# Patient Record
Sex: Male | Born: 1975 | Race: Black or African American | Hispanic: No | Marital: Single | Smoking: Current every day smoker
Health system: Southern US, Community
[De-identification: ages and names within clinical notes are randomized; demographics above are authoritative.]

---

## 2000-07-28 ENCOUNTER — Emergency Department (HOSPITAL_COMMUNITY): Admission: EM | Admit: 2000-07-28 | Discharge: 2000-07-28 | Payer: Self-pay | Admitting: Emergency Medicine

## 2010-01-19 ENCOUNTER — Emergency Department (HOSPITAL_COMMUNITY): Admission: EM | Admit: 2010-01-19 | Discharge: 2010-01-19 | Payer: Self-pay | Admitting: Emergency Medicine

## 2010-02-13 ENCOUNTER — Emergency Department (HOSPITAL_COMMUNITY): Admission: EM | Admit: 2010-02-13 | Discharge: 2010-02-13 | Payer: Self-pay | Admitting: Emergency Medicine

## 2010-02-21 ENCOUNTER — Emergency Department (HOSPITAL_COMMUNITY): Admission: EM | Admit: 2010-02-21 | Discharge: 2010-02-21 | Payer: Self-pay | Admitting: Orthopaedic Surgery

## 2010-03-07 ENCOUNTER — Emergency Department (HOSPITAL_COMMUNITY): Admission: EM | Admit: 2010-03-07 | Discharge: 2010-03-07 | Payer: Self-pay | Admitting: Emergency Medicine

## 2010-03-16 ENCOUNTER — Emergency Department (HOSPITAL_COMMUNITY): Admission: EM | Admit: 2010-03-16 | Discharge: 2010-03-16 | Payer: Self-pay | Admitting: Emergency Medicine
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
## 2011-01-16 LAB — URINALYSIS, ROUTINE W REFLEX MICROSCOPIC
Bilirubin Urine: NEGATIVE
Glucose, UA: NEGATIVE mg/dL
Ketones, ur: 15 mg/dL — AB
Nitrite: NEGATIVE

## 2011-01-16 LAB — URINE MICROSCOPIC-ADD ON

## 2011-01-22 LAB — GC/CHLAMYDIA PROBE AMP, GENITAL: Chlamydia, DNA Probe: NEGATIVE

## 2013-05-06 ENCOUNTER — Emergency Department (HOSPITAL_COMMUNITY)
Admission: EM | Admit: 2013-05-06 | Discharge: 2013-05-06 | Disposition: A | Discharge status: Home / Self Care | Payer: Self-pay | Attending: Emergency Medicine | Admitting: Emergency Medicine

## 2013-05-06 ENCOUNTER — Encounter (HOSPITAL_COMMUNITY): Payer: Self-pay

## 2013-05-06 DIAGNOSIS — R300 Dysuria: Secondary | ICD-10-CM | POA: Insufficient documentation

## 2013-05-06 DIAGNOSIS — N342 Other urethritis: Secondary | ICD-10-CM | POA: Insufficient documentation

## 2013-05-06 MED ORDER — AZITHROMYCIN 250 MG PO TABS
1000.0000 mg | ORAL_TABLET | Freq: Once | ORAL | Status: AC
Start: 2013-05-06 — End: 2013-05-06
  Administered 2013-05-06: 1000 mg via ORAL
  Filled 2013-05-06: qty 4

## 2013-05-06 MED ORDER — CEFTRIAXONE SODIUM 250 MG IJ SOLR
250.0000 mg | Freq: Once | INTRAMUSCULAR | Status: AC
  Administered 2013-05-06: 250 mg via INTRAMUSCULAR
  Filled 2013-05-06: qty 250

## 2013-05-06 MED ORDER — LIDOCAINE HCL (PF) 1 % IJ SOLN
INTRAMUSCULAR | Status: AC
  Administered 2013-05-06: 1 mL
  Filled 2013-05-06: qty 5

## 2013-05-06 NOTE — ED Provider Notes (Signed)
   History    CSN: [id number] Arrival date & time 05/06/13  1113  First MD Initiated Contact with Patient 05/06/13 1211     Chief Complaint  Patient presents with  . 74.5    (Consider location/radiation/quality/duration/timing/severity/associated sxs/prior Treatment) Patient is a 37 y.o. male presenting with penile discharge. The history is provided by the patient. No language interpreter was used.  Penile Discharge This is a new problem. The current episode started yesterday. Pertinent negatives include no abdominal pain, fever or nausea. Associated symptoms comments: "I think I got something during sex." He reports penile discharge and burning with urination for the past 2 days. No fever, abdominal pain, testicular pain or scrotal swelling. Marland Kitchen   History reviewed. No pertinent past medical history. History reviewed. No pertinent past surgical history. No family history on file. History  Substance Use Topics  . Smoking status: Never Smoker   . Smokeless tobacco: Not on file  . Alcohol Use: No    Review of Systems  Constitutional: Negative for fever.  Gastrointestinal: Negative for nausea and abdominal pain.  Genitourinary: Positive for dysuria and discharge. Negative for hematuria, flank pain, scrotal swelling and testicular pain.    Allergies  Review of patient's allergies indicates no known allergies.  Home Medications  No current outpatient prescriptions on file. BP 131/70  Pulse 70  Temp(Src) 98.5 F (36.9 C) (Oral)  Resp 20  Ht 5\' 10"  (1.778 m)  Wt 240 lb (108.863 kg)  BMI 34.44 kg/m2  SpO2 97% Physical Exam  Constitutional: He is oriented to person, place, and time. He appears well-developed and well-nourished. No distress.  Pulmonary/Chest: Effort normal.  Genitourinary: Penis normal.  No scrotal swelling or mass. No testicular tenderness.   Lymphadenopathy:       Left: Inguinal adenopathy present.  Neurological: He is alert and oriented to person, place, and  time.    ED Course  Procedures (including critical care time) Labs Reviewed  GC/CHLAMYDIA PROBE AMP   No results found. No diagnosis found. 1. Urethritis  MDM  GC/Chlamydia culture pending. Patient treated with Rocephin and azithromycin.   Arnoldo Hooker, PA-C 05/06/13 1342

## 2013-05-06 NOTE — ED Notes (Signed)
Pt c/o penile discharge and burning with urination x 2 days.

## 2013-05-07 LAB — GC/CHLAMYDIA PROBE AMP
CT Probe RNA: NEGATIVE
GC Probe RNA: POSITIVE — AB

## 2013-05-07 NOTE — ED Provider Notes (Signed)
Medical screening examination/treatment/procedure(s) were performed by non-physician practitioner and as supervising physician I was immediately available for consultation/collaboration.    Shereece Wellborn J. Jyaire Koudelka, MD 05/07/13 0815 

## 2013-05-08 NOTE — ED Notes (Signed)
+   Gonorrhea Patient treated with Rocephin and Zithromax [employer] letter faxed

## 2013-05-10 ENCOUNTER — Telehealth (HOSPITAL_COMMUNITY): Payer: Self-pay | Admitting: Emergency Medicine

## 2013-05-17 ENCOUNTER — Telehealth (HOSPITAL_COMMUNITY): Payer: Self-pay | Admitting: Emergency Medicine

## 2013-05-17 NOTE — ED Notes (Signed)
Unable to contact patient via phone. Sent letter. °

## 2013-06-01 NOTE — ED Notes (Signed)
No response from letter after 30 days.Chart closed out and sent to medical records 

## 2013-10-01 ENCOUNTER — Encounter (HOSPITAL_COMMUNITY): Payer: Self-pay | Admitting: Emergency Medicine

## 2013-10-01 ENCOUNTER — Emergency Department (HOSPITAL_COMMUNITY)
Admission: EM | Admit: 2013-10-01 | Discharge: 2013-10-02 | Disposition: A | Discharge status: Home / Self Care | Payer: Self-pay | Attending: Emergency Medicine | Admitting: Emergency Medicine

## 2013-10-01 DIAGNOSIS — R0982 Postnasal drip: Secondary | ICD-10-CM | POA: Insufficient documentation

## 2013-10-01 DIAGNOSIS — Z7901 Long term (current) use of anticoagulants: Secondary | ICD-10-CM | POA: Insufficient documentation

## 2013-10-01 DIAGNOSIS — J329 Chronic sinusitis, unspecified: Secondary | ICD-10-CM | POA: Insufficient documentation

## 2013-10-01 DIAGNOSIS — F17200 Nicotine dependence, unspecified, uncomplicated: Secondary | ICD-10-CM | POA: Insufficient documentation

## 2013-10-01 DIAGNOSIS — R51 Headache: Secondary | ICD-10-CM | POA: Insufficient documentation

## 2013-10-01 NOTE — ED Notes (Signed)
Headache for 1 week, No injury.  Nasal congestion and pain , nausea,

## 2013-10-02 MED ORDER — AMOXICILLIN 500 MG PO CAPS: 500.0000 mg | ORAL_CAPSULE | Freq: Three times a day (TID) | ORAL | Status: AC

## 2013-10-02 MED ORDER — FEXOFENADINE-PSEUDOEPHED ER 60-120 MG PO TB12: 1.0000 | ORAL_TABLET | Freq: Two times a day (BID) | ORAL | Status: DC

## 2013-10-02 MED ORDER — IBUPROFEN 600 MG PO TABS: 600.0000 mg | ORAL_TABLET | Freq: Four times a day (QID) | ORAL | Status: DC | PRN

## 2013-10-02 MED ORDER — IBUPROFEN 800 MG PO TABS
800.0000 mg | ORAL_TABLET | Freq: Once | ORAL | Status: AC
  Administered 2013-10-02: 800 mg via ORAL
  Filled 2013-10-02: qty 1

## 2013-10-02 MED ORDER — AMOXICILLIN 250 MG PO CAPS
500.0000 mg | ORAL_CAPSULE | Freq: Once | ORAL | Status: AC
  Administered 2013-10-02: 500 mg via ORAL
  Filled 2013-10-02: qty 2

## 2013-10-02 NOTE — ED Provider Notes (Signed)
CSN: [id number]     Arrival date & time 10/01/13  2236 History   First MD Initiated Contact with Patient 10/01/13 2352     Chief Complaint  Patient presents with  . Headache   (Consider location/radiation/quality/duration/timing/severity/associated sxs/prior Treatment) HPI Comments: Jeffrey Elliott is a 37 y.o. Male presenting with a one week history of intermittent daily right sided headache along with nasal congestion, increased post nasal drip and mild nausea without emesis.  His symptoms improve at night, but then wakes with significant thick nasal and throat congestion and worsening nasal congestion throughout the day.  He has not had fevers or chills, and denies dizziness, visual changes, vomiting, neck pain or stiffness, also without focal weakness.  He has tried multiple otc treatments including tylenol, ibuprofen and one of his wife's "pain pills" given here after her hysterectomy she had this week without relief.     The history is provided by the patient.    History reviewed. No pertinent past medical history. History reviewed. No pertinent past surgical history. History reviewed. No pertinent family history. History  Substance Use Topics  . Smoking status: Current Every Day Smoker    Types: Cigarettes  . Smokeless tobacco: Not on file  . Alcohol Use: No    Review of Systems  Constitutional: Negative for fever and chills.  HENT: Positive for congestion, postnasal drip, rhinorrhea and sinus pressure. Negative for ear discharge, hearing loss, nosebleeds, sore throat and tinnitus.   Eyes: Negative.   Respiratory: Negative for chest tightness and shortness of breath.   Cardiovascular: Negative for chest pain.  Gastrointestinal: Negative for nausea and abdominal pain.  Genitourinary: Negative.   Musculoskeletal: Negative for arthralgias, joint swelling and neck pain.  Skin: Negative.  Negative for rash and wound.  Neurological: Positive for headaches. Negative for dizziness,  weakness, light-headedness and numbness.  Psychiatric/Behavioral: Negative.     Allergies  Review of patient's allergies indicates no known allergies.  Home Medications   Current Outpatient Rx  Name  Route  Sig  Dispense  Refill  . amoxicillin (AMOXIL) 500 MG capsule   Oral   Take 1 capsule (500 mg total) by mouth 3 (three) times daily.   30 capsule   0   . fexofenadine-pseudoephedrine (ALLEGRA-D) 60-120 MG per tablet   Oral   Take 1 tablet by mouth every 12 (twelve) hours.   30 tablet   0   . ibuprofen (ADVIL,MOTRIN) 600 MG tablet   Oral   Take 1 tablet (600 mg total) by mouth every 6 (six) hours as needed.   30 tablet   0    BP 124/83  Pulse 55  Temp(Src) 97.8 F (36.6 C) (Oral)  Resp 20  Ht 5\' 11"  (1.803 m)  Wt 235 lb (106.595 kg)  BMI 32.79 kg/m2  SpO2 100% Physical Exam  Nursing note and vitals reviewed. Constitutional: He is oriented to person, place, and time. He appears well-developed and well-nourished.  Uncomfortable appearing  HENT:  Head: Normocephalic and atraumatic.  Right Ear: Tympanic membrane, external ear and ear canal normal.  Left Ear: Tympanic membrane, external ear and ear canal normal.  Nose: Mucosal edema present. Right sinus exhibits frontal sinus tenderness.  Mouth/Throat: Uvula is midline, oropharynx is clear and moist and mucous membranes are normal.  Eyes: EOM are normal. Pupils are equal, round, and reactive to light.  Neck: Normal range of motion. Neck supple.  Cardiovascular: Normal rate and normal heart sounds.   Pulmonary/Chest: Effort normal.  Abdominal: Soft.  There is no tenderness.  Musculoskeletal: Normal range of motion.  Lymphadenopathy:    He has no cervical adenopathy.  Neurological: He is alert and oriented to person, place, and time. He has normal strength. No sensory deficit. Gait normal. GCS eye subscore is 4. GCS verbal subscore is 5. GCS motor subscore is 6.  Normal heel-shin, normal rapid alternating movements.  Cranial nerves III-XII intact.  No pronator drift.  Skin: Skin is warm and dry. No rash noted.  Psychiatric: He has a normal mood and affect. His speech is normal and behavior is normal. Thought content normal. Cognition and memory are normal.    ED Course  Procedures (including critical care time) Labs Review Labs Reviewed - No data to display Imaging Review No results found.  EKG Interpretation   None       MDM   1. Sinusitis   2. Headache    Exam and history suggestive of sinusitis as source of headache.  He was prescribed amoxil, ibuprofen,  Allegra D.  Encouraged increased fluid intake, rest, recheck if not improving over the next week.    No neuro deficit on exam or by history to suggest emergent process.        Burgess Amor, PA-C 10/02/13 [phone]

## 2013-10-02 NOTE — ED Provider Notes (Signed)
Medical screening examination/treatment/procedure(s) were performed by non-physician practitioner and as supervising physician I was immediately available for consultation/collaboration.  EKG Interpretation   None         Joya Gaskins, MD 10/02/13 [phone]

## 2014-01-16 ENCOUNTER — Encounter (HOSPITAL_COMMUNITY): Payer: Self-pay | Admitting: Emergency Medicine

## 2014-01-16 ENCOUNTER — Emergency Department (HOSPITAL_COMMUNITY)
Admission: EM | Admit: 2014-01-16 | Discharge: 2014-01-16 | Disposition: A | Discharge status: Home / Self Care | Payer: Self-pay | Attending: Emergency Medicine | Admitting: Emergency Medicine

## 2014-01-16 DIAGNOSIS — K529 Noninfective gastroenteritis and colitis, unspecified: Secondary | ICD-10-CM

## 2014-01-16 DIAGNOSIS — K5289 Other specified noninfective gastroenteritis and colitis: Secondary | ICD-10-CM | POA: Insufficient documentation

## 2014-01-16 DIAGNOSIS — F17200 Nicotine dependence, unspecified, uncomplicated: Secondary | ICD-10-CM | POA: Insufficient documentation

## 2014-01-16 LAB — CBC WITH DIFFERENTIAL/PLATELET
BASOS ABS: 0 10*3/uL (ref 0.0–0.1)
BASOS PCT: 1 % (ref 0–1)
EOS PCT: 1 % (ref 0–5)
Eosinophils Absolute: 0 10*3/uL (ref 0.0–0.7)
HCT: 45.7 % (ref 39.0–52.0)
HEMOGLOBIN: 15 g/dL (ref 13.0–17.0)
LYMPHS ABS: 1.1 10*3/uL (ref 0.7–4.0)
Lymphocytes Relative: 26 % (ref 12–46)
MCH: 28.3 pg (ref 26.0–34.0)
MCHC: 32.8 g/dL (ref 30.0–36.0)
MCV: 86.2 fL (ref 78.0–100.0)
MONO ABS: 0.4 10*3/uL (ref 0.1–1.0)
MONOS PCT: 9 % (ref 3–12)
NEUTROS ABS: 2.6 10*3/uL (ref 1.7–7.7)
NEUTROS PCT: 64 % (ref 43–77)
PLATELETS: 184 10*3/uL (ref 150–400)
RBC: 5.3 MIL/uL (ref 4.22–5.81)
RDW: 13.5 % (ref 11.5–15.5)
WBC: 4.1 10*3/uL (ref 4.0–10.5)

## 2014-01-16 LAB — COMPREHENSIVE METABOLIC PANEL
ALK PHOS: 47 U/L (ref 39–117)
ALT: 51 U/L (ref 0–53)
AST: 29 U/L (ref 0–37)
Albumin: 4.1 g/dL (ref 3.5–5.2)
BILIRUBIN TOTAL: 0.5 mg/dL (ref 0.3–1.2)
BUN: 11 mg/dL (ref 6–23)
CO2: 25 mEq/L (ref 19–32)
Calcium: 8.5 mg/dL (ref 8.4–10.5)
Chloride: 101 mEq/L (ref 96–112)
Creatinine, Ser: 1.56 mg/dL — ABNORMAL HIGH (ref 0.50–1.35)
GFR, EST AFRICAN AMERICAN: 64 mL/min — AB (ref >90)
GFR, EST NON AFRICAN AMERICAN: 55 mL/min — AB (ref >90)
Glucose, Bld: 99 mg/dL (ref 70–99)
POTASSIUM: 3.5 meq/L — AB (ref 3.7–5.3)
SODIUM: 138 meq/L (ref 137–147)
Total Protein: 8.5 g/dL — ABNORMAL HIGH (ref 6.0–8.3)

## 2014-01-16 MED ORDER — SODIUM CHLORIDE 0.9 % IV BOLUS (SEPSIS)
1000.0000 mL | Freq: Once | INTRAVENOUS | Status: AC
  Administered 2014-01-16: 1000 mL via INTRAVENOUS

## 2014-01-16 MED ORDER — ONDANSETRON HCL 4 MG/2ML IJ SOLN
4.0000 mg | Freq: Once | INTRAMUSCULAR | Status: AC
  Administered 2014-01-16: 4 mg via INTRAVENOUS
  Filled 2014-01-16: qty 2

## 2014-01-16 MED ORDER — KETOROLAC TROMETHAMINE 30 MG/ML IJ SOLN
30.0000 mg | Freq: Once | INTRAMUSCULAR | Status: AC
  Administered 2014-01-16: 30 mg via INTRAVENOUS
  Filled 2014-01-16: qty 1

## 2014-01-16 MED ORDER — PROMETHAZINE HCL 25 MG PO TABS: 25.0000 mg | ORAL_TABLET | Freq: Four times a day (QID) | ORAL | Status: AC | PRN

## 2014-01-16 NOTE — ED Provider Notes (Signed)
CSN: [id number]     Arrival date & time 01/16/14  1628 History   First MD Initiated Contact with Patient 01/16/14 1807     Chief Complaint  Patient presents with  . Emesis     (Consider location/radiation/quality/duration/timing/severity/associated sxs/prior Treatment) Patient is a 38 y.o. male presenting with vomiting. The history is provided by the patient (the pt complains of diarhea and vomiting).  Emesis Severity:  Moderate Timing:  Constant Quality:  Undigested food Progression:  Unchanged Chronicity:  New Relieved by:  Nothing Associated symptoms: diarrhea   Associated symptoms: no abdominal pain and no headaches     History reviewed. No pertinent past medical history. History reviewed. No pertinent past surgical history. No family history on file. History  Substance Use Topics  . Smoking status: Current Every Day Smoker    Types: Cigarettes  . Smokeless tobacco: Not on file  . Alcohol Use: No    Review of Systems  Constitutional: Negative for appetite change and fatigue.  HENT: Negative for congestion, drooling, ear discharge and sinus pressure.   Eyes: Negative for discharge.  Respiratory: Negative for cough.   Cardiovascular: Negative for chest pain.  Gastrointestinal: Positive for vomiting and diarrhea. Negative for abdominal pain.  Genitourinary: Negative for frequency and hematuria.  Musculoskeletal: Negative for back pain.  Skin: Negative for rash.  Neurological: Negative for seizures and headaches.  Psychiatric/Behavioral: Negative for hallucinations.      Allergies  Review of patient's allergies indicates no known allergies.  Home Medications   Current Outpatient Rx  Name  Route  Sig  Dispense  Refill  . ibuprofen (ADVIL,MOTRIN) 200 MG tablet   Oral   Take 200 mg by mouth every 6 (six) hours as needed.         . promethazine (PHENERGAN) 25 MG tablet   Oral   Take 1 tablet (25 mg total) by mouth every 6 (six) hours as needed for nausea or  vomiting.   15 tablet   0    BP 112/71  Pulse 62  Temp(Src) 98.8 F (37.1 C) (Oral)  Resp 20  Ht 5\' 11"  (1.803 m)  Wt 230 lb (104.327 kg)  BMI 32.09 kg/m2  SpO2 100% Physical Exam  Constitutional: He is oriented to person, place, and time. He appears well-developed.  HENT:  Head: Normocephalic.  Eyes: Conjunctivae and EOM are normal. No scleral icterus.  Neck: Neck supple. No thyromegaly present.  Cardiovascular: Normal rate and regular rhythm.  Exam reveals no gallop and no friction rub.   No murmur heard. Pulmonary/Chest: No stridor. He has no wheezes. He has no rales. He exhibits no tenderness.  Abdominal: He exhibits no distension. There is no tenderness. There is no rebound.  Musculoskeletal: Normal range of motion. He exhibits no edema.  Lymphadenopathy:    He has no cervical adenopathy.  Neurological: He is oriented to person, place, and time. He exhibits normal muscle tone. Coordination normal.  Skin: No rash noted. No erythema.  Psychiatric: He has a normal mood and affect. His behavior is normal.    ED Course  Procedures (including critical care time) Labs Review Labs Reviewed  COMPREHENSIVE METABOLIC PANEL - Abnormal; Notable for the following:    Potassium 3.5 (*)    Creatinine, Ser 1.56 (*)    Total Protein 8.5 (*)    GFR calc non Af Amer 55 (*)    GFR calc Af Amer 64 (*)    All other components within normal limits  CBC WITH DIFFERENTIAL  Imaging Review No results found.   EKG Interpretation None      MDM   Final diagnoses:  Gastroenteritis       Benny LennertJoseph L Astraea Gaughran, MD 01/16/14 2010

## 2014-01-16 NOTE — Discharge Instructions (Signed)
Take imodium for diarhea and drink plenty of fluids.  Follow up if not improving

## 2014-01-16 NOTE — ED Notes (Signed)
Pt c/o n/v/d abd cramping, chills that started three days ago, reports that his children were sick with same symptoms this week as well,
